# Patient Record
Sex: Female | Born: 1980 | Race: White | Hispanic: No | Smoking: Current every day smoker
Health system: Southern US, Community
[De-identification: ages and names within clinical notes are randomized; demographics above are authoritative.]

## PROBLEM LIST (undated history)

## (undated) DIAGNOSIS — F419 Anxiety disorder, unspecified: Secondary | ICD-10-CM

## (undated) HISTORY — DX: Anxiety disorder, unspecified: F41.9

---

## 2013-10-11 ENCOUNTER — Emergency Department: Payer: Self-pay | Admitting: Emergency Medicine

## 2013-10-12 ENCOUNTER — Emergency Department: Payer: Self-pay | Admitting: Emergency Medicine

## 2013-10-14 ENCOUNTER — Emergency Department: Payer: Self-pay

## 2014-09-14 IMAGING — CR RIGHT TIBIA AND FIBULA - 2 VIEW
1 series · 3 of 3 positions shown · non-contrast
Comparison: 10/11/2013

CLINICAL DATA: Known tibial fracture. Continued pain. No new new
injuries.

EXAM:
RIGHT TIBIA AND FIBULA - 2 VIEW

[Series 1: x tib-fib ap right · 0.14mm/px · 3 of 3 slices shown]
[im 1/3]
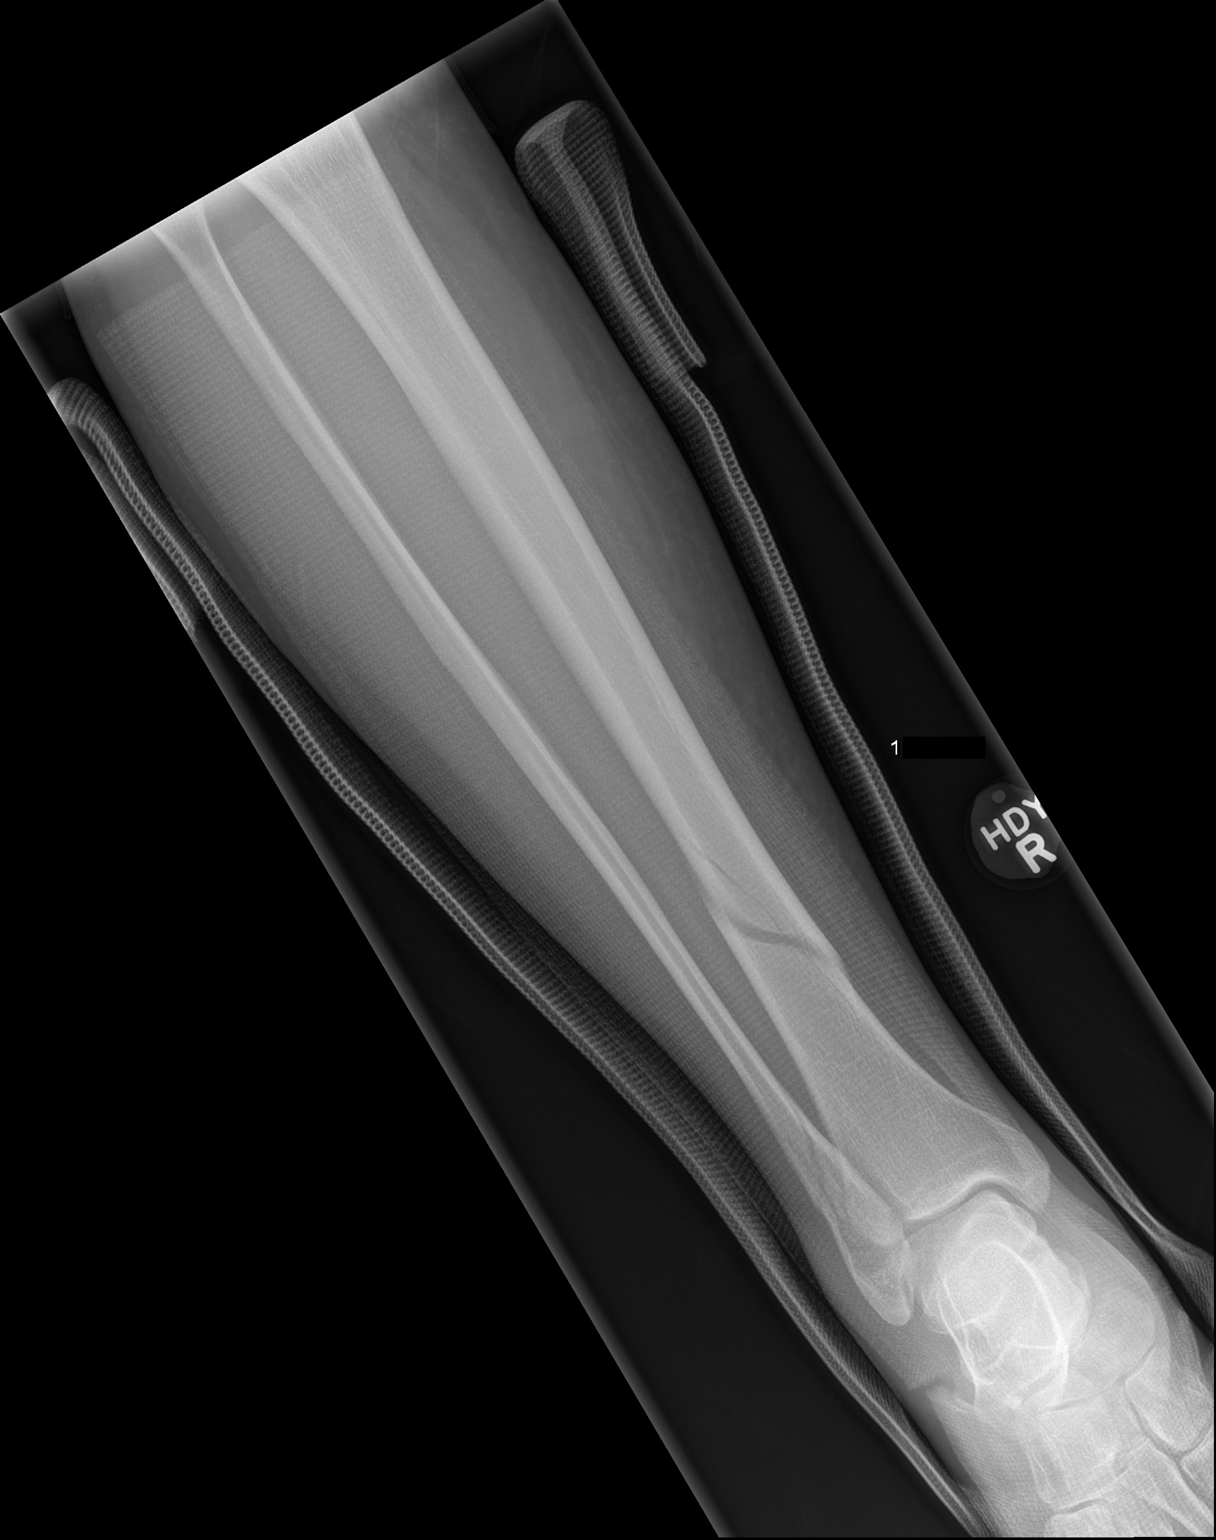
[im 2/3]
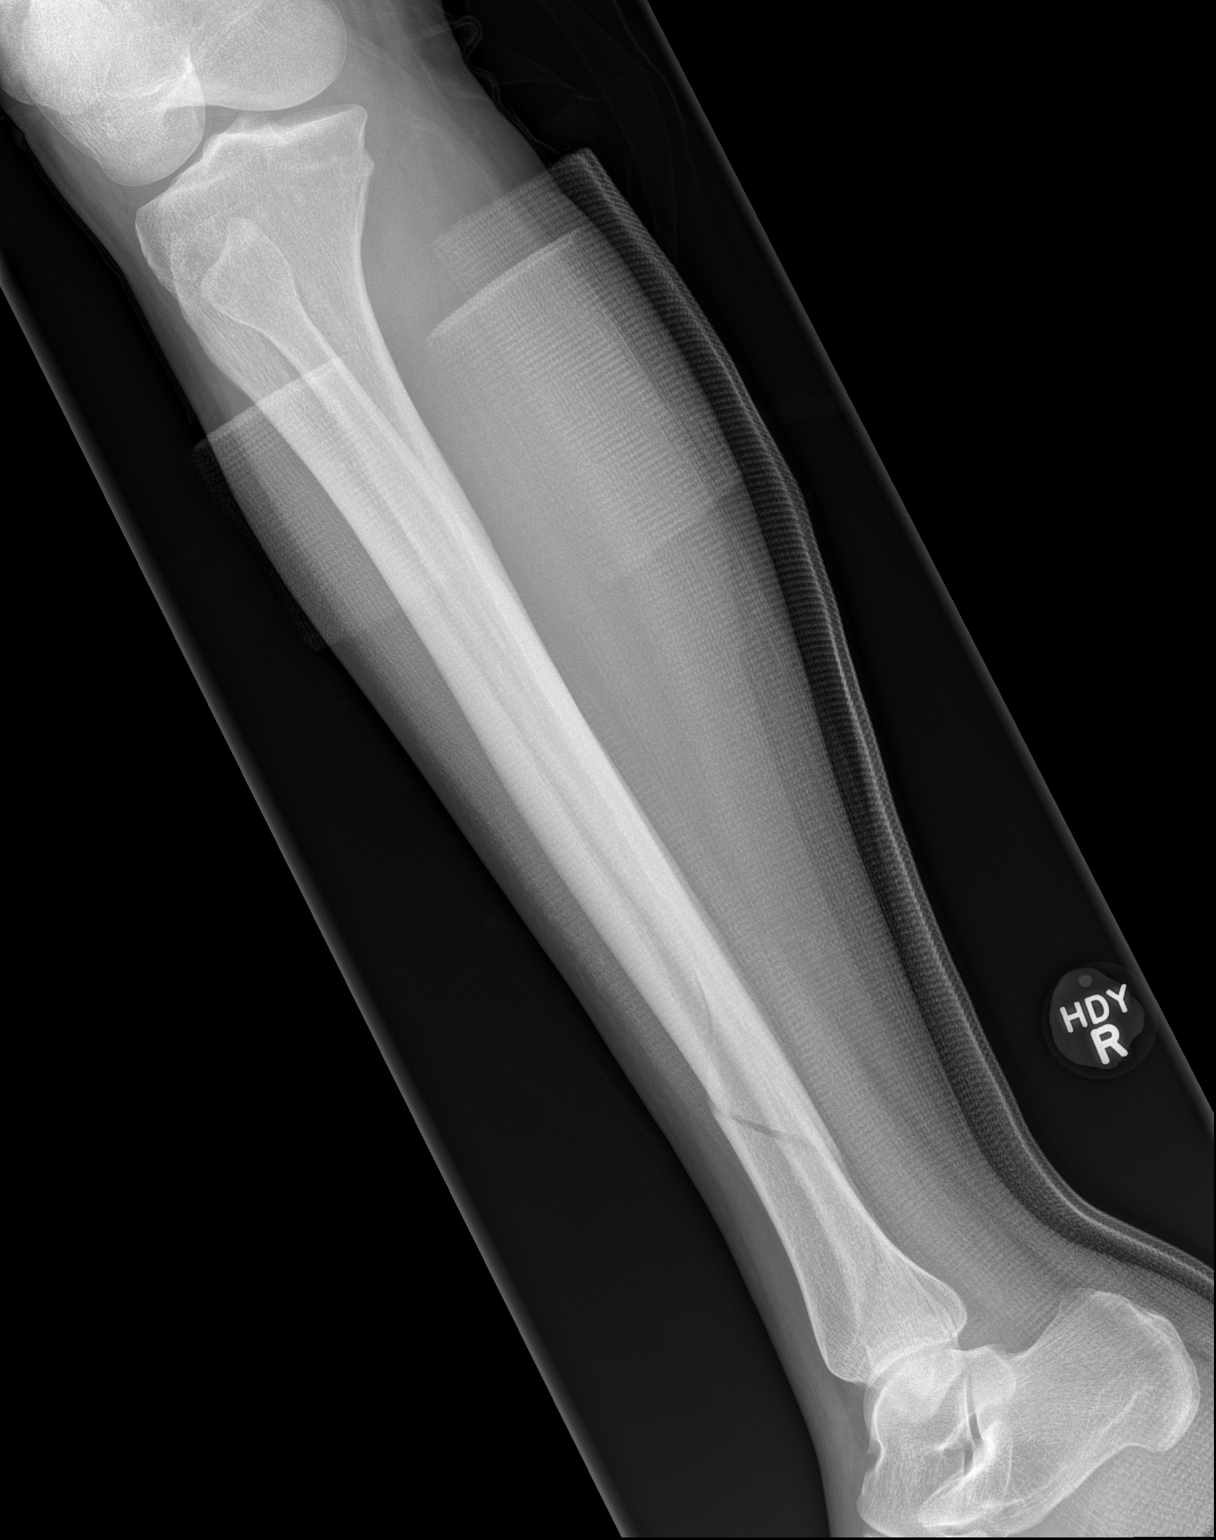
[im 3/3]
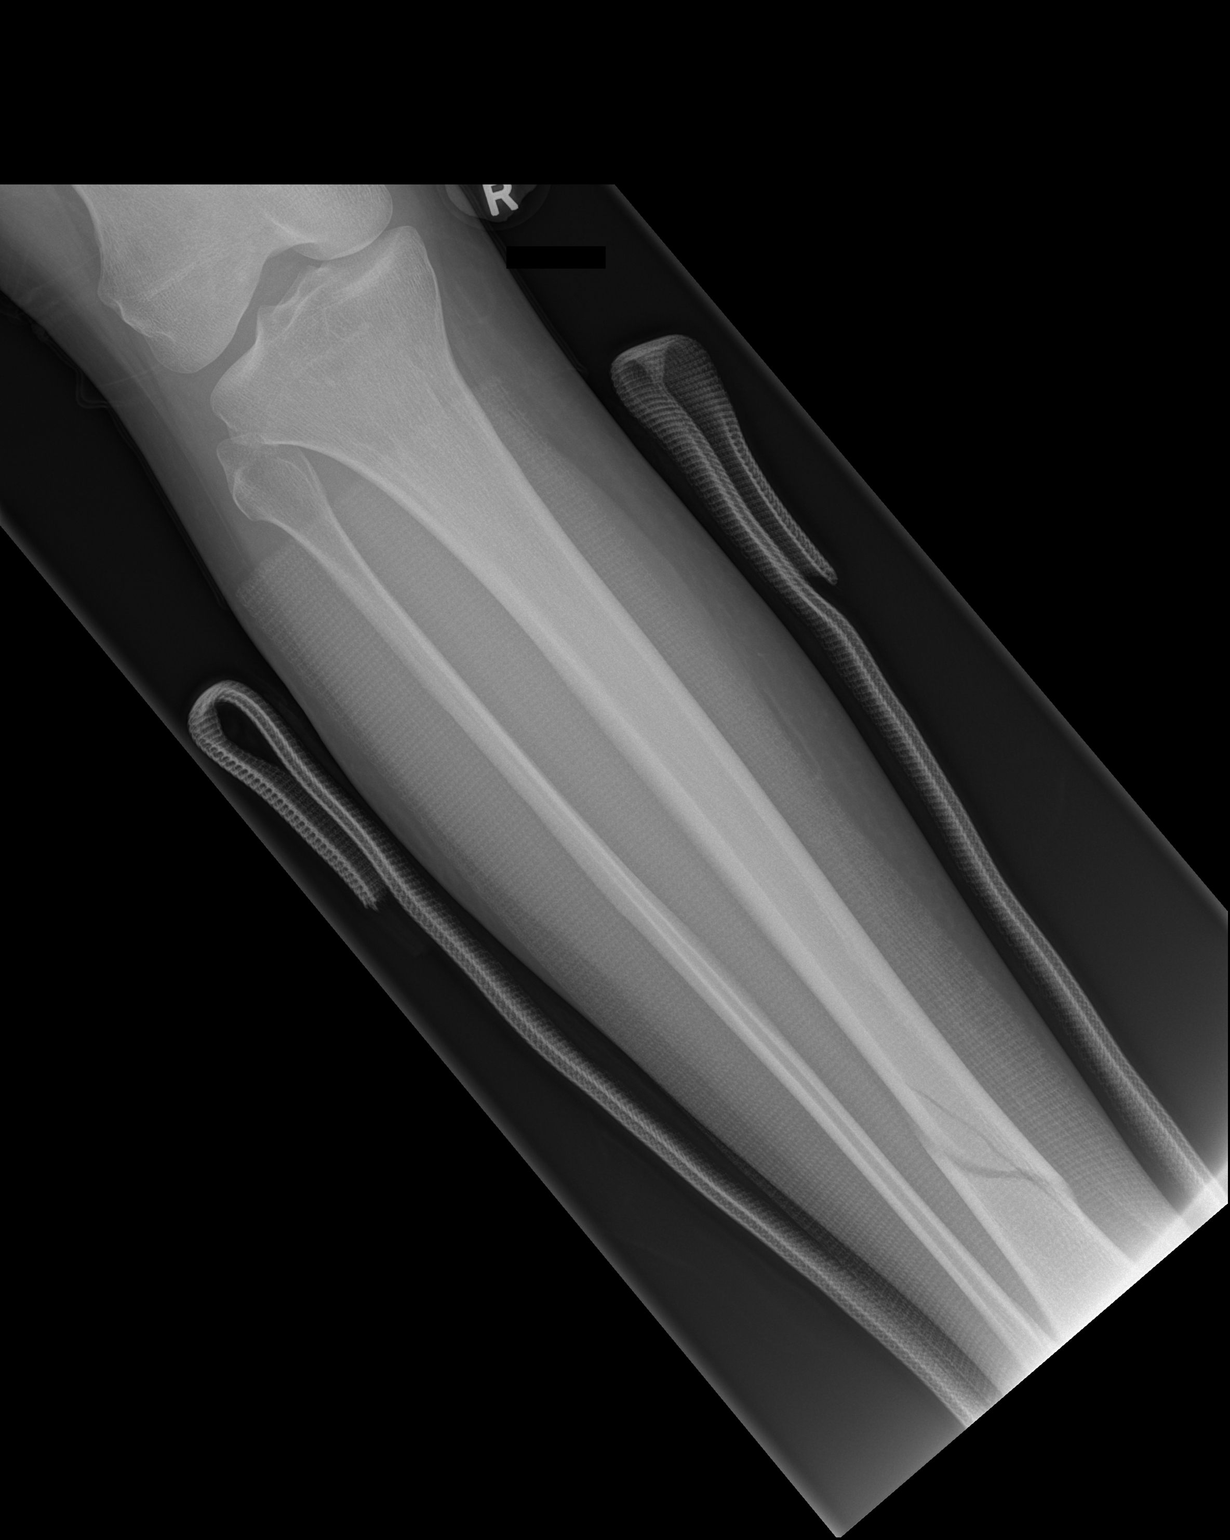

[3 of 3 positions shown; findings below may reference images not displayed]

FINDINGS: Spiral fracture of the distal tibial shaft is nondisplaced and
nonangulated and unchanged. A spiral fracture of the distal fibula
crossing the meta diaphysis is also stable and nondisplaced and
nonangulated.

Leg is now supported by a posterior splint.

No new fractures. The knee joint and ankle joint are normally
aligned.
IMPRESSION: Stable fractures of the distal right tibia and fibula. No new
abnormalities.

## 2014-12-03 ENCOUNTER — Emergency Department: Payer: Self-pay | Admitting: Emergency Medicine
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
## 2015-05-28 ENCOUNTER — Ambulatory Visit: Payer: Medicaid Other | Attending: Family Medicine | Admitting: Physical Therapy

## 2015-05-28 ENCOUNTER — Encounter: Payer: Self-pay | Admitting: Physical Therapy

## 2015-05-28 DIAGNOSIS — M25522 Pain in left elbow: Secondary | ICD-10-CM | POA: Diagnosis present

## 2015-05-28 DIAGNOSIS — M25521 Pain in right elbow: Secondary | ICD-10-CM | POA: Insufficient documentation

## 2015-05-28 DIAGNOSIS — M545 Low back pain, unspecified: Secondary | ICD-10-CM
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
## 2015-06-17 ENCOUNTER — Ambulatory Visit: Payer: BLUE CROSS/BLUE SHIELD | Attending: Family Medicine | Admitting: Physical Therapy

## 2015-06-17 DIAGNOSIS — M25522 Pain in left elbow: Secondary | ICD-10-CM | POA: Insufficient documentation

## 2015-06-17 DIAGNOSIS — M25521 Pain in right elbow: Secondary | ICD-10-CM | POA: Insufficient documentation

## 2015-06-17 DIAGNOSIS — M545 Low back pain: Secondary | ICD-10-CM | POA: Insufficient documentation

## 2015-06-19 ENCOUNTER — Encounter: Payer: Self-pay | Admitting: Physical Therapy

## 2015-06-19 ENCOUNTER — Ambulatory Visit: Payer: BLUE CROSS/BLUE SHIELD | Admitting: Physical Therapy

## 2015-06-19 DIAGNOSIS — M545 Low back pain, unspecified: Secondary | ICD-10-CM

## 2015-06-19 DIAGNOSIS — M25521 Pain in right elbow: Secondary | ICD-10-CM

## 2015-06-19 DIAGNOSIS — M25522 Pain in left elbow: Secondary | ICD-10-CM | POA: Diagnosis present

## 2015-06-19 NOTE — Therapy (Signed)
[facility] MAIN [facility] SERVICES [address] [place], [place], [address] Phone: [phone]   Fax:  [phone]  Physical Therapy Treatment  Patient Details  Name: Sonya Lewis MRN: [id number] Date of Birth: 1981-07-22 Referring Provider:  Titus Mould*  Encounter Date: 06/19/2015      PT End of Session - 06/19/15 1552    Visit Number 2   Number of Visits 16   Date for PT Re-Evaluation 07/23/15   Authorization Type getting private insurance Jun 14, 2015   PT Start Time 1349   PT Stop Time 1434   PT Time Calculation (min) 45 min   Activity Tolerance Patient limited by pain   Behavior During Therapy [facility] for tasks assessed/performed      Past Medical History  Diagnosis Date  . Anxiety     controlled    History reviewed. No pertinent past surgical history.  There were no vitals filed for this visit.  Visit Diagnosis:  Bilateral low back pain without sciatica  Elbow pain, left  Elbow pain, right      Subjective Assessment - 06/19/15 1359    Subjective Patient reports continued bilateral elbow pain (L>R) she also continues to have soreness in low back. Patient denies any radicular symptoms.    Limitations Lifting   How long can you sit comfortably? 30 min   How long can you stand comfortably? 30 min   How long can you walk comfortably? 30 min   Diagnostic tests Denies any radiographs of back and elbow;    Patient Stated Goals Pain relief   Currently in Pain? Yes   Pain Score 5    Pain Location Back   Pain Orientation Lower   Pain Descriptors / Indicators Aching   Pain Type --   Pain Onset 1 to 4 weeks ago   Pain Score 7   Pain Location Elbow   Pain Orientation Medial  bilaterally   Pain Descriptors / Indicators Tender;Sore;Sharp   Pain Onset 1 to 4 weeks ago          TREATMENT: PT identified increased pain in bilateral elbows Assessed strength: elbow flexion: R: 4/5 (no pain), L: 4-/5 (slight pain)                                Elbow extension: R: 3+/5 (no pain), L: 3+/5 (slight pain)                                 Pronation: painful with resistance  Patient reports slight increase in pain with wrist flexion, but no pain with extension;  PT instructed patient in wrist flexor stretch with cues to increase supination for better pronator stretch 20 sec hold x2 bilaterally;  PT identified increased tightness and trigger points in bilateral wrist flexors. PT performed soft/deep tissue massage including cross friction, myofascial release and sustained pressure for manual trigger point release to bilateral forearm (L>R) x20 min;  Patient continues to have trigger points in wrist flexors that refer pain to elbow after manual therapy;  Patient received dry needling therapy education and acknowledged understanding of risks and benefits of dry needling therapy prior to receiving treatment. Patient voiced understanding of treatment options and elected to proceed with dry needling therapy.   Morrie Sheldon Tortorici PT, DPT who is certified in dry needling, performed dry needling to patient's left  pronator teres, flexor digitorum superficialis, and flexor carpi ulnaris. Patient tolerated well with deep ache sensation. No local twitch response was noted. She experienced slightly less discomfort after treatment.                        PT Education - 06/19/15 1552    Education provided Yes   Education Details re-educated patient in bilateral elbow flexor stretches, with cues for positioning;    Person(s) Educated Patient   Methods Explanation;Demonstration;Tactile cues;Verbal cues   Comprehension Verbalized understanding;Returned demonstration;Verbal cues required;Tactile cues required             PT Long Term Goals - 05/28/15 1646    PT LONG TERM GOAL #1   Title Patient will be independent in home exercise program to improve strength/mobility for better functional independence with  ADLs by 07/23/15   Time 8   Period Weeks   Status New   PT LONG TERM GOAL #2   Title Patient will report a worst pain of 3/10 on VAS in   low back          to improve tolerance with ADLs and reduced symptoms with activities by 07/23/15   Time 8   Period Weeks   Status New   PT LONG TERM GOAL #3   Title Patient will report a worst pain of 3/10 on VAS in    B elbows         to improve tolerance with lifting and household ADLs by 07/23/15   Time 8   Period Weeks   Status New   PT LONG TERM GOAL #4   Title  Patient will reduce modified Oswestry score to <20 as to demonstrate minimal disability with ADLs including improved sleeping tolerance, walking/sitting tolerance etc for better mobility with ADLs by 07/23/15   Time 8   Period Weeks   Status New   PT LONG TERM GOAL #5   Title Patient will be independent in lifting 30 pounds from floor to waist and carrying at least 10 feet for independence in laudry by 07/23/15   Time 8   Period Weeks   Status New               Plan - 06/19/15 1553    Clinical Impression Statement Patient continues to have elbow pain which is more severe than back pain. PT identified increased tightness and trigger points in bilateral forearm wrist flexors (L>R). PT instructed patient in wrist flexor stretch and also performed extensive manual therapy including soft/deep tissue massage, myofascial release and sustained pressure for trigger point release. Morrie Sheldon Tortorici PT, DPT, certified in dry needling performed dry neeling to left forearm to reduce trigger points for less elbow pain. Patient continues to be limited by pain at end of treatment session. She would benefit from additional skilled PT intervention to reduce pain and improve tolerance with ADLs.   Pt will benefit from skilled therapeutic intervention in order to improve on the following deficits Improper body mechanics;Postural dysfunction;Difficulty walking;Decreased mobility;Decreased activity  tolerance;Hypomobility;Pain;Impaired UE functional use   Rehab Potential Good   Clinical Impairments Affecting Rehab Potential positive: motivated, good PLOF, negative: severe pain;    PT Frequency 2x / week   PT Duration 8 weeks   PT Treatment/Interventions Gait training;Functional mobility training;Manual techniques;Therapeutic activities;Cryotherapy;Therapeutic exercise;Electrical Stimulation;Iontophoresis /ml Dexamethasone;Passive range of motion;Moist Heat;Dry needling;Traction;Ultrasound;Patient/family education;Energy conservation;Taping   PT Next Visit Plan continue with manual therapy/modalities, dry needling if indicated, advance exercise  PT Home Exercise Plan instructed patient in wrist flexor stretches   Consulted and Agree with Plan of Care Patient        Problem List There are no active problems to display for this patient.    Hopkins,Margaret PT, DPT 06/19/2015, 4:35 PM  [facility] MAIN [facility] SERVICES [address] [place], [place], [address] Phone: [phone]   Fax:  [phone]

## 2015-06-24 ENCOUNTER — Ambulatory Visit: Payer: BLUE CROSS/BLUE SHIELD | Admitting: Physical Therapy

## 2015-06-26 ENCOUNTER — Ambulatory Visit: Payer: BLUE CROSS/BLUE SHIELD | Admitting: Physical Therapy

## 2015-06-27 ENCOUNTER — Emergency Department
Admission: EM | Admit: 2015-06-27 | Discharge: 2015-06-27 | Disposition: A | Discharge status: Home / Self Care | Payer: BLUE CROSS/BLUE SHIELD | Attending: Emergency Medicine | Admitting: Emergency Medicine

## 2015-06-27 ENCOUNTER — Encounter: Payer: Self-pay | Admitting: Emergency Medicine

## 2015-06-27 ENCOUNTER — Emergency Department: Payer: BLUE CROSS/BLUE SHIELD

## 2015-06-27 DIAGNOSIS — Z720 Tobacco use: Secondary | ICD-10-CM | POA: Insufficient documentation

## 2015-06-27 DIAGNOSIS — Z79899 Other long term (current) drug therapy: Secondary | ICD-10-CM | POA: Insufficient documentation

## 2015-06-27 DIAGNOSIS — S40012A Contusion of left shoulder, initial encounter: Secondary | ICD-10-CM | POA: Diagnosis not present

## 2015-06-27 DIAGNOSIS — Y9289 Other specified places as the place of occurrence of the external cause: Secondary | ICD-10-CM | POA: Insufficient documentation

## 2015-06-27 DIAGNOSIS — Y9389 Activity, other specified: Secondary | ICD-10-CM | POA: Diagnosis not present

## 2015-06-27 DIAGNOSIS — W108XXA Fall (on) (from) other stairs and steps, initial encounter: Secondary | ICD-10-CM | POA: Diagnosis not present

## 2015-06-27 DIAGNOSIS — S4992XA Unspecified injury of left shoulder and upper arm, initial encounter: Secondary | ICD-10-CM | POA: Diagnosis present

## 2015-06-27 DIAGNOSIS — Y998 Other external cause status: Secondary | ICD-10-CM | POA: Insufficient documentation

## 2015-06-27 MED ORDER — NAPROXEN 500 MG PO TABS: 500.0000 mg | ORAL_TABLET | Freq: Two times a day (BID) | ORAL | Status: AC

## 2015-06-27 MED ORDER — HYDROCODONE-ACETAMINOPHEN 5-325 MG PO TABS
1.0000 | ORAL_TABLET | Freq: Once | ORAL | Status: AC
  Administered 2015-06-27: 1 via ORAL
  Filled 2015-06-27: qty 1

## 2015-06-27 MED ORDER — HYDROCODONE-ACETAMINOPHEN 5-325 MG PO TABS: 1.0000 | ORAL_TABLET | ORAL | Status: AC | PRN
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
## 2015-06-27 NOTE — Discharge Instructions (Signed)
Contusion °A contusion is a deep bruise. Contusions happen when an injury causes bleeding under the skin. Symptoms of bruising include pain, swelling, and discolored skin. The skin may turn blue, purple, or yellow. °HOME CARE  °· Rest the injured area. °· If told, put ice on the injured area. °· Put ice in a plastic bag. °· Place a towel between your skin and the bag. °· Leave the ice on for 20 minutes, 2-3 times per day. °· If told, put light pressure (compression) on the injured area using an elastic bandage. Make sure the bandage is not too tight. Remove it and put it back on as told by your doctor. °· If possible, raise (elevate) the injured area above the level of your heart while you are sitting or lying down. °· Take over-the-counter and prescription medicines only as told by your doctor. °GET HELP IF: °· Your symptoms do not get better after several days of treatment. °· Your symptoms get worse. °· You have trouble moving the injured area. °GET HELP RIGHT AWAY IF:  °· You have very bad pain. °· You have a loss of feeling (numbness) in a hand or foot. °· Your hand or foot turns pale or cold. °  °This information is not intended to replace advice given to you by your health care provider. Make sure you discuss any questions you have with your health care provider. °  °Document Released: 02/16/2008 Document Revised: 05/21/2015 Document Reviewed: 01/15/2015 °Elsevier Interactive Patient Education ©2016 [employer] ° °Cryotherapy °Cryotherapy is when you put ice on your injury. Ice helps lessen pain and puffiness (swelling) after an injury. Ice works the best when you start using it in the first 24 to 48 hours after an injury. °HOME CARE °· Put a dry or damp towel between the ice pack and your skin. °· You may press gently on the ice pack. °· Leave the ice on for no more than 10 to 20 minutes at a time. °· Check your skin after 5 minutes to make sure your skin is okay. °· Rest at least 20 minutes between ice  pack uses. °· Stop using ice when your skin loses feeling (numbness). °· Do not use ice on someone who cannot tell you when it hurts. This includes small children and people with memory problems (dementia). °GET HELP RIGHT AWAY IF: °· You have white spots on your skin. °· Your skin turns blue or pale. °· Your skin feels waxy or hard. °· Your puffiness gets worse. °MAKE SURE YOU:  °· Understand these instructions. °· Will watch your condition. °· Will get help right away if you are not doing well or get worse. °  °This information is not intended to replace advice given to you by your health care provider. Make sure you discuss any questions you have with your health care provider. °  °Document Released: 02/16/2008 Document Revised: 11/22/2011 Document Reviewed: 04/22/2011 °Elsevier Interactive Patient Education ©2016 [employer]vier Inc. ° °

## 2015-06-27 NOTE — ED Provider Notes (Signed)
[facility] Emergency Department Provider Note  ____________________________________________  Time seen: Approximately 9:55 AM  I have reviewed the triage vital signs and the nursing notes.   HISTORY  Chief Complaint Shoulder Pain   HPI Sonya Lewis is a 34 y.o. female is here complaining of left shoulder pain. Patient states she slipped down the basement steps and hit her shoulder. She denies any head injury or loss of consciousness. She has taken Tylenol at approximately 4 AM this morning for the pain. She denies any previous injury to her shoulder. Pain is increased with range of motion and also with deep inspiration. Holding it against her body seems to help with pain but with minimal improvement. Currently she rates her pain as 10 out of 10. She denies any neck pain with this.She denies any nausea, vomiting or visual changes.   Past Medical History  Diagnosis Date  . Anxiety     controlled    There are no active problems to display for this patient.   History reviewed. No pertinent past surgical history.  Current Outpatient Rx  Name  Route  Sig  Dispense  Refill  . amphetamine-dextroamphetamine (ADDERALL) 15 MG tablet   Oral   Take 15 mg by mouth daily.         . cyclobenzaprine (FLEXERIL) 5 MG tablet   Oral   Take 5 mg by mouth 3 (three) times daily as needed for muscle spasms.         Marland Kitchen. HYDROcodone-acetaminophen (NORCO/VICODIN) 5-325 MG tablet   Oral   Take 1 tablet by mouth every 4 (four) hours as needed for moderate pain.   20 tablet   0   . naproxen (NAPROSYN) 500 MG tablet   Oral   Take 1 tablet (500 mg total) by mouth 2 (two) times daily with a meal.   30 tablet   0     Allergies Review of patient's allergies indicates no known allergies.  No family history on file.  Social History Social History  Substance Use Topics  . Smoking status: Current Every Day Smoker -- 0.50 packs/day for 17 years  . Smokeless tobacco:  None  . Alcohol Use: Yes    Review of Systems Constitutional: No fever/chills Eyes: No visual changes. Cardiovascular: Denies chest pain. Respiratory: Denies shortness of breath. Gastrointestinal: No abdominal pain.  No nausea, no vomiting.  Genitourinary: Negative for dysuria. Musculoskeletal: Negative for back pain. Left shoulder pain  Skin: Negative for rash. Neurological: Negative for headaches, focal weakness or numbness.  10-point ROS otherwise negative.  ____________________________________________   PHYSICAL EXAM:  VITAL SIGNS: ED Triage Vitals  Enc Vitals Group     BP 06/27/15 0924 130/83 mmHg     Pulse Rate 06/27/15 0924 114     Resp 06/27/15 0924 20     Temp 06/27/15 0924 98.2 F (36.8 C)     Temp Source 06/27/15 0924 Oral     SpO2 06/27/15 0924 98 %     Weight 06/27/15 0924 130 lb (58.968 kg)     Height 06/27/15 0924 5\' 5"  (1.651 m)     Head Cir --      Peak Flow --      Pain Score 06/27/15 0924 10     Pain Loc --      Pain Edu? --      Excl. in GC? --     Constitutional: Alert and oriented. Well appearing and in no acute distress. Eyes: Conjunctivae are normal. PERRL.  EOMI. Head: Atraumatic. Nose: No congestion/rhinnorhea. Neck: No stridor.  No cervical tenderness on palpation. Cardiovascular: Normal rate, regular rhythm. Grossly normal heart sounds.  Good peripheral circulation. Respiratory: Normal respiratory effort.  No retractions. Lungs CTAB. Gastrointestinal: Soft and nontender. No distention.  Musculoskeletal: Left shoulder no gross deformity. There is moderate tenderness on palpation of posterior left shoulder. There is some tenderness on palpation of the before meals joint without deformity. Range of motion is restricted secondary to discomfort. There is no edema noted. Motor sensory function intact. No lower extremity tenderness nor edema.  No joint effusions. Neurologic:  Normal speech and language. No gross focal neurologic deficits are  appreciated. No gait instability. Skin:  Skin is warm, dry and intact. No rash noted. There is no abrasions or ecchymosis noted on the left shoulder. Psychiatric: Mood and affect are normal. Speech and behavior are normal.  ____________________________________________   LABS (all labs ordered are listed, but only abnormal results are displayed)  Labs Reviewed - No data to display   RADIOLOGY  X-ray left shoulder per radiologist shows no fracture dislocation. I, Tommi Rumps, personally viewed and evaluated these images (plain radiographs) as part of my medical decision making.  ____________________________________________   PROCEDURES  Procedure(s) performed: None  Critical Care performed: No  ____________________________________________   INITIAL IMPRESSION / ASSESSMENT AND PLAN / ED COURSE  Pertinent labs & imaging results that were available during my care of the patient were reviewed by me and considered in my medical decision making (see chart for details).  Patient was placed in a sling. She is use ice to her shoulder area. She is also given a prescription for naproxen 500 mg twice a day with food and hydrocodone for severe pain. She is to follow-up with her PCP or Dr. Martha Clan if any continued problems.     FINAL CLINICAL IMPRESSION(S) / ED DIAGNOSES  Final diagnoses:  Contusion of left shoulder, initial encounter      Tommi Rumps, PA-C 06/27/15 1037  Sharyn Creamer, MD 06/27/15 [phone]

## 2015-07-01 ENCOUNTER — Ambulatory Visit: Payer: BLUE CROSS/BLUE SHIELD | Admitting: Physical Therapy

## 2015-07-01 DIAGNOSIS — M545 Low back pain, unspecified: Secondary | ICD-10-CM

## 2015-07-01 DIAGNOSIS — M25521 Pain in right elbow: Secondary | ICD-10-CM

## 2015-07-01 DIAGNOSIS — M25522 Pain in left elbow: Secondary | ICD-10-CM

## 2015-07-02 NOTE — Therapy (Signed)
[place] [facility] [address] [place], [place], [address] Phone: [phone]   Fax:  [phone]  Physical Therapy Treatment  Patient Details  Name: Sonya Lewis MRN: [id number] Date of Birth: 1981-06-11 No Data Recorded  Encounter Date: 07/01/2015      PT End of Session - 07/01/15 1031    Visit Number 3   Number of Visits 16   Date for PT Re-Evaluation 07/23/15   Authorization Type getting private insurance Jun 14, 2015   PT Start Time [phone]   PT Stop Time 0930   PT Time Calculation (min) 40 min   Activity Tolerance No increased pain;Patient tolerated treatment well   Behavior During Therapy [facility] for tasks assessed/performed      Past Medical History  Diagnosis Date  . Anxiety     controlled    No past surgical history on file.  There were no vitals filed for this visit.  Visit Diagnosis:  Bilateral low back pain without sciatica  Elbow pain, left  Elbow pain, right      Subjective Assessment - 07/01/15 0854    Subjective Patient reports bilateral elbow pain and L shoulder pain which started after she experienced a fall earlier this week. She went to the ER for X-rays, which were negative. She reports lower back pain    Limitations Lifting   How long can you sit comfortably? 30 min   How long can you stand comfortably? 30 min   How long can you walk comfortably? 30 min   Diagnostic tests Denies any radiographs of back and elbow;    Patient Stated Goals Pain relief   Currently in Pain? --  Patient reports pain in her elbows bilaterally, L shoulder with rotation, and  mid to lower back      Manual Therapy   Soft tissue mobilization provided to L lumbar and flank musculature with nearly complete resolution of lumbar complaints.   Attempted soft tissue mobilization to LUE in distal biceps tendon and lateral epicondyle, increased in sensitivity and discontinued.  Collateral ligament testing in LUE revealed  firm end feel bilaterally with no laxity noted medially or laterally.   Dry needling was performed in this session on L pronator teres without twitch response. Dry needling provided by Haskel KhanAshley Tortorici PT, DPT who is a certified dry needling practitioner. Risks and benefits were explained for dry needling and patient consented to treatment.                        PT Education - 07/01/15 1030    Education provided Yes   Education Details Educated patient on pain neuroscience. Use of ice on her R elbow, heat on her lower back.    Person(s) Educated Patient   Methods Explanation;Demonstration   Comprehension Verbalized understanding;Returned demonstration             PT Long Term Goals - 05/28/15 1646    PT LONG TERM GOAL #1   Title Patient will be independent in home exercise program to improve strength/mobility for better functional independence with ADLs by 07/23/15   Time 8   Period Weeks   Status New   PT LONG TERM GOAL #2   Title Patient will report a worst pain of 3/10 on VAS in   low back          to improve tolerance with ADLs and reduced symptoms with activities by 07/23/15   Time 8  Period Weeks   Status New   PT LONG TERM GOAL #3   Title Patient will report a worst pain of 3/10 on VAS in    B elbows         to improve tolerance with lifting and household ADLs by 07/23/15   Time 8   Period Weeks   Status New   PT LONG TERM GOAL #4   Title  Patient will reduce modified Oswestry score to <20 as to demonstrate minimal disability with ADLs including improved sleeping tolerance, walking/sitting tolerance etc for better mobility with ADLs by 07/23/15   Time 8   Period Weeks   Status New   PT LONG TERM GOAL #5   Title Patient will be independent in lifting 30 pounds from floor to waist and carrying at least 10 feet for independence in laudry by 07/23/15   Time 8   Period Weeks   Status New               Plan - 07/01/15 1033    Clinical Impression  Statement Patient reports significant decrease in lower back pain symptoms after soft tissue mobilization. Patient reports proper response to dry needling of RUE, she reported significant improvement from needling in previous session. Patient continues to be quite sensitivie around her medial epicondyle of bilateral elbows, on RUE ligamentous testing revealed firm end feel with discomfort but no discernable laxity present. She may be experiencing pain at this spot in response to thoracic pain inhibiting periscapular musculature. Skilled PT services continue to be indicated to address her mobility and functional activity limitations.    Pt will benefit from skilled therapeutic intervention in order to improve on the following deficits Improper body mechanics;Postural dysfunction;Difficulty walking;Decreased mobility;Decreased activity tolerance;Hypomobility;Pain;Impaired UE functional use   Rehab Potential Good   Clinical Impairments Affecting Rehab Potential positive: motivated, good PLOF, negative: severe pain;    PT Frequency 2x / week   PT Duration 8 weeks   PT Treatment/Interventions Gait training;Functional mobility training;Manual techniques;Therapeutic activities;Cryotherapy;Therapeutic exercise;Electrical Stimulation;Iontophoresis /ml Dexamethasone;Passive range of motion;Moist Heat;Dry needling;Traction;Ultrasound;Patient/family education;Energy conservation;Taping   PT Next Visit Plan continue with manual therapy/modalities, dry needling if indicated, advance exercise   PT Home Exercise Plan Maintained from previous    Consulted and Agree with Plan of Care Patient        Problem List There are no active problems to display for this patient.  Kerin Ransom, PT, DPT    07/02/2015, 3:14 PM  [place] [facility] MAIN [facility] SERVICES [address] [place], [place], [address] Phone: [phone]   Fax:  [phone]  Name: Sonya Lewis MRN:  [id number] Date of Birth: 1980-11-04

## 2015-07-03 ENCOUNTER — Encounter: Payer: Medicaid Other | Admitting: Physical Therapy

## 2015-07-04 ENCOUNTER — Ambulatory Visit: Payer: BLUE CROSS/BLUE SHIELD | Admitting: Physical Therapy

## 2015-07-08 ENCOUNTER — Encounter: Payer: Self-pay | Admitting: Physical Therapy

## 2015-07-10 ENCOUNTER — Encounter: Payer: BLUE CROSS/BLUE SHIELD | Admitting: Physical Therapy

## 2015-07-15 ENCOUNTER — Ambulatory Visit: Payer: BLUE CROSS/BLUE SHIELD | Attending: Family Medicine | Admitting: Physical Therapy

## 2015-07-15 ENCOUNTER — Encounter: Payer: Self-pay | Admitting: Physical Therapy

## 2015-07-15 DIAGNOSIS — M25522 Pain in left elbow: Secondary | ICD-10-CM | POA: Diagnosis present

## 2015-07-15 DIAGNOSIS — M545 Low back pain, unspecified: Secondary | ICD-10-CM

## 2015-07-15 DIAGNOSIS — M25521 Pain in right elbow: Secondary | ICD-10-CM

## 2015-07-15 NOTE — Therapy (Signed)
[facility] [address] [place], [place], [address] Phone: [phone]   Fax:  [phone]  Physical Therapy Treatment  Patient Details  Name: Sonya Lewis MRN: [id number] Date of Birth: 1981/06/20 No Data Recorded  Encounter Date: 07/15/2015      PT End of Session - 07/15/15 0944    Visit Number 4   Number of Visits 16   Date for PT Re-Evaluation 07/23/15   Authorization Type getting private insurance Jun 14, 2015   PT Start Time 0805   PT Stop Time [phone]   PT Time Calculation (min) 41 min   Activity Tolerance Patient limited by pain   Behavior [facility] Therapy [facility] tasks assessed/performed      Past Medical History  Diagnosis Date  . Anxiety     controlled    History reviewed. No pertinent past surgical history.  There were no vitals filed for this visit.  Visit Diagnosis:  Bilateral low back pain without sciatica  Elbow pain, left  Elbow pain, right      Subjective Assessment - 07/15/15 0942    Subjective Patient reports overall less right elbow pain but continued left elbow pain. She reports continued back pain which is less than elbow pain. She reports increased discomfort after having to carry luggage when travelling.   Limitations Lifting   How long can you sit comfortably? 30 min   How long can you stand comfortably? 30 min   How long can you walk comfortably? 30 min   Diagnostic tests Denies any radiographs of back and elbow;    Patient Stated Goals Pain relief   Currently in Pain? Yes   Pain Score 8    Pain Location Elbow   Pain Orientation Left   Pain Descriptors / Indicators Aching;Burning;Tender   Pain Type Acute pain   Pain Onset 1 to 4 weeks ago   Pain Frequency Intermittent   Aggravating Factors  more pain with movement   Pain Relieving Factors heat helps, rest   Effect of Pain on Daily Activities decreased mobility;         TREATMENT: PT instructed patient in bilateral UE  stretches including: Forearm wrist flexor stretch 10 sec hold x2 bilaterally; tricep stretch across chest 15 sec hold x2 bilaterally; tricep overhead stretch 15 sec hold x2 bilaterally;  PT identified increased tightness noted in tricep tendon along elbow which was reduced with tricep stretches. Patient required min VCs for correct positioning during stretch for better tissue extensibility;  PT applied moist heat to bilateral elbows concurrent with back treatment: Patient prone: PT identified increased tightness along bilateral thoracolumbar paraspinals including quadratus lumborum; PT performed soft/deep tissue massage and ASTYM with edge tool to bilateral thoracolumbar paraspinals. Also performed cross friction and ischemic trigger point release to right quadratus lumborus to reduce tightness. Patient able to exhibit good skin and myofascial mobility as evidenced with finger rolling. She does however have trigger points along paraspinals which was reduced with ASTYM and cross friction massage.  Patient reports less back/elbow pain after treatment session;  PT instructed patient in qped stretches: Prayer stretch 10 sec hold x2 Cat/camel stretch 3 sec each direction x4 each;  Patient required min-moderate verbal/tactile cues for correct exercise technique including cues for correct positioning for optimum stretching.                        PT Education - 07/15/15 [phone]    Education provided Yes  Education Details educated patient on UE and back stretches, importance of HEP compliance   Person(s) Educated Patient   Methods Explanation;Demonstration;Verbal cues   Comprehension Verbalized understanding;Returned demonstration;Verbal cues required             PT Long Term Goals - 05/28/15 1646    PT LONG TERM GOAL #1   Title Patient will be independent in home exercise program to improve strength/mobility for better functional independence with ADLs by 07/23/15    Time 8   Period Weeks   Status New   PT LONG TERM GOAL #2   Title Patient will report a worst pain of 3/10 on VAS in   low back          to improve tolerance with ADLs and reduced symptoms with activities by 07/23/15   Time 8   Period Weeks   Status New   PT LONG TERM GOAL #3   Title Patient will report a worst pain of 3/10 on VAS in    B elbows         to improve tolerance with lifting and household ADLs by 07/23/15   Time 8   Period Weeks   Status New   PT LONG TERM GOAL #4   Title  Patient will reduce modified Oswestry score to <20 as to demonstrate minimal disability with ADLs including improved sleeping tolerance, walking/sitting tolerance etc for better mobility with ADLs by 07/23/15   Time 8   Period Weeks   Status New   PT LONG TERM GOAL #5   Title Patient will be independent in lifting 30 pounds from floor to waist and carrying at least 10 feet for independence in laudry by 07/23/15   Time 8   Period Weeks   Status New               Plan - 07/15/15 0944    Clinical Impression Statement Patient is still very pain focused. She is most limited in left elbow with any movement of LUE. She reports less pain in right elbow to 2-3/10 which seems to be improving. Patient was instructed in additional UE stretches. PT applied moist heat to BUE elbows concurrent with back treatment. PT identified increased tightness along bilateral thoracolumbar paraspinals (R>L) and increased tightness along right quadratus lumborum. She continues to report increased pain at end of treatment session despite decreased tightness noted by PT. Patient would benefit from additional skilled PT Intervention to reduce pain and improve mobility.    Pt will benefit from skilled therapeutic intervention in order to improve on the following deficits Improper body mechanics;Postural dysfunction;Difficulty walking;Decreased mobility;Decreased activity tolerance;Hypomobility;Pain;Impaired UE functional use   Rehab  Potential Good   Clinical Impairments Affecting Rehab Potential positive: motivated, good PLOF, negative: severe pain;    PT Frequency 2x / week   PT Duration 8 weeks   PT Treatment/Interventions Gait training;Functional mobility training;Manual techniques;Therapeutic activities;Cryotherapy;Therapeutic exercise;Electrical Stimulation;Iontophoresis /ml Dexamethasone;Passive range of motion;Moist Heat;Dry needling;Traction;Ultrasound;Patient/family education;Energy conservation;Taping   PT Next Visit Plan continue with manual therapy/modalities, dry needling if indicated, advance exercise   PT Home Exercise Plan advanced with BUE stretches and quadratus lumborum stretches   Consulted and Agree with Plan of Care Patient        Problem List There are no active problems to display for this patient.   Hopkins,Broden Holt PT, DPT 07/15/2015, 9:47 AM  [place] [facility] MAIN [facility] SERVICES [address] [place], [place], [address] Phone: [phone]   Fax:  [phone]  Name: Sonya Lewis MRN: [id number] Date of Birth: [birth year]

## 2015-07-18 ENCOUNTER — Ambulatory Visit: Payer: BLUE CROSS/BLUE SHIELD | Admitting: Physical Therapy

## 2015-07-18 ENCOUNTER — Encounter: Payer: Self-pay | Admitting: Physical Therapy

## 2015-07-18 DIAGNOSIS — M545 Low back pain, unspecified: Secondary | ICD-10-CM

## 2015-07-18 DIAGNOSIS — M25522 Pain in left elbow: Secondary | ICD-10-CM

## 2015-07-18 DIAGNOSIS — M25521 Pain in right elbow: Secondary | ICD-10-CM

## 2015-07-18 NOTE — Therapy (Signed)
[place] [facility] MAIN [facility] SERVICES [address] [place], [place], [address] Phone: [phone]   Fax:  [phone]  Physical Therapy Treatment  Patient Details  Name: Sonya Lewis MRN: [id number] Date of Birth: Oct 07, 1980 No Data Recorded  Encounter Date: 07/18/2015      PT End of Session - 07/18/15 0910    Visit Number 5   Number of Visits 16   Date for PT Re-Evaluation 07/23/15   Authorization Type getting private insurance Jun 14, 2015   PT Start Time 0800   PT Stop Time 0845   PT Time Calculation (min) 45 min   Activity Tolerance Patient limited by pain   Behavior During Therapy [facility] for tasks assessed/performed      Past Medical History  Diagnosis Date  . Anxiety     controlled    History reviewed. No pertinent past surgical history.  There were no vitals filed for this visit.  Visit Diagnosis:  Bilateral low back pain without sciatica  Elbow pain, left  Elbow pain, right      Subjective Assessment - 07/18/15 0908    Subjective Patient reports compliance with BUE elbow stretches. She denies any pain in right elbow but continues to have pain in left elbow. She also reports continued ache in back. She reports that the "massage helped" after last treatment but she still has some soreness.   Limitations Lifting   How long can you sit comfortably? 30 min   How long can you stand comfortably? 30 min   How long can you walk comfortably? 30 min   Diagnostic tests Denies any radiographs of back and elbow;    Patient Stated Goals Pain relief   Currently in Pain? Yes   Pain Score 6    Pain Location Elbow   Pain Orientation Left   Pain Descriptors / Indicators Aching;Burning;Tender   Pain Onset 1 to 4 weeks ago      TREATMENT: PT instructed patient in bilateral UE stretches including: Forearm wrist flexor stretch 10 sec hold x3 LUE only; Instructed patient in correct posture body mechanics with min VCs to utilize towel roll or  small pillow along lumbar spinal to improve lumbar extension and increase erect posture. Patient required education on how having better posture will allow lumbar spinal muscles to relax and therefore heal as compared to having slumped posture where muscles would be on prolonged stretch and get weaker.  PT performed Ultrasound to left medial elbow in sitting, 1 MHz, 1.8 watts per centimeter squared, x8 min concurrent with biofreeze/gel for topical pain relief. Patient reports feeling warmth from [place] and slightly less pain after treatment. She continues to have soreness after treatment and still has moderate tenderness along tendons.   PT applied moist heat to left elbow concurrent with back treatment: Patient prone: PT identified increased tightness along bilateral lumbar paraspinals including quadratus lumborum; PT performed soft/deep tissue massage and ASTYM with edge tool to bilateral thoracolumbar paraspinals. Also performed cross friction and ischemic trigger point release to right quadratus lumborus to reduce tightness. Patient able to exhibit good skin and myofascial mobility as evidenced with finger rolling. She also exhibits less thoracic paraspinal tightness as compared to previous sessions.  She does however have trigger points along paraspinals which was reduced with ASTYM and cross friction massage.  Patient reports less back/elbow pain after treatment session; Re-educated patient on importance of qped stretches to increase quadratus lumborum flexibility and reduce pain.  PT Education - 07/18/15 0910    Education provided Yes   Education Details body mechanics/posture, ultrasound effects,    Person(s) Educated Patient   Methods Explanation;Demonstration;Verbal cues   Comprehension Returned demonstration;Verbalized understanding;Verbal cues required             PT Long Term Goals - 05/28/15 1646    PT LONG TERM GOAL #1   Title  Patient will be independent in home exercise program to improve strength/mobility for better functional independence with ADLs by 07/23/15   Time 8   Period Weeks   Status New   PT LONG TERM GOAL #2   Title Patient will report a worst pain of 3/10 on VAS in   low back          to improve tolerance with ADLs and reduced symptoms with activities by 07/23/15   Time 8   Period Weeks   Status New   PT LONG TERM GOAL #3   Title Patient will report a worst pain of 3/10 on VAS in    B elbows         to improve tolerance with lifting and household ADLs by 07/23/15   Time 8   Period Weeks   Status New   PT LONG TERM GOAL #4   Title  Patient will reduce modified Oswestry score to <20 as to demonstrate minimal disability with ADLs including improved sleeping tolerance, walking/sitting tolerance etc for better mobility with ADLs by 07/23/15   Time 8   Period Weeks   Status New   PT LONG TERM GOAL #5   Title Patient will be independent in lifting 30 pounds from floor to waist and carrying at least 10 feet for independence in laudry by 07/23/15   Time 8   Period Weeks   Status New               Plan - 07/18/15 [id number]    Clinical Impression Statement Patient continues to have pain in left elbow and lower back. PT performed US to left elbow with biofreeze for topical pain relief. Patient reports soreness afterwards but does report slight improvement of symptoms. PT assessed low back, patient exhibits less tightness in thoracic paraspinals but continues to have pain and tenderness with tightness in quadrtus lumborum and lumbar paraspinals. Educated patient on importance of good posture. She would benefit from additional skilled PT intervention to reduce pain and improve functional mobility.    Pt will benefit from skilled therapeutic intervention in order to improve on the following deficits Improper body mechanics;Postural dysfunction;Difficulty walking;Decreased mobility;Decreased activity  tolerance;Hypomobility;Pain;Impaired UE functional use   Rehab Potential Good   Clinical Impairments Affecting Rehab Potential positive: motivated, good PLOF, negative: severe pain; smoker   PT Frequency 2x / week   PT Duration 8 weeks   PT Treatment/Interventions Gait training;Functional mobility training;Manual techniques;Therapeutic activities;Cryotherapy;Therapeutic exercise;Electrical Stimulation;Iontophoresis 4mg /ml Dexamethasone;Passive range of motion;Moist Heat;Dry needling;Traction;Ultrasound;Patient/family education;Energy conservation;Taping   PT Next Visit Plan continue with manual therapy/modalities, dry needling if indicated, advance exercise   PT Home Exercise Plan Continue as previously given.   Consulted and Agree with Plan of Care Patient        Problem List There are no active problems to display for this patient.   Hopkins,Kealohilani Maiorino PT, DPT 07/18/2015, 9:45 AM  [place] [facility] MAIN [facility] [address] [place], [place], [address] Phone: [phone]   Fax:  [phone]  Name: Sonya Lewis MRN: [id number] Date of Birth: 12-Dec-1980

## 2015-07-22 ENCOUNTER — Ambulatory Visit: Payer: BLUE CROSS/BLUE SHIELD | Admitting: Physical Therapy

## 2015-07-25 ENCOUNTER — Ambulatory Visit: Payer: BLUE CROSS/BLUE SHIELD | Admitting: Physical Therapy

## 2016-05-27 IMAGING — CR DG SHOULDER 2+V*L*
1 series · 3 of 3 positions shown · non-contrast
Comparison: None.

CLINICAL DATA: Pain following fall

EXAM:
LEFT SHOULDER - 2+ VIEW

[Series 1: w shoulder external left · 0.14mm/px · 3 of 3 slices shown]
[im 1/3]
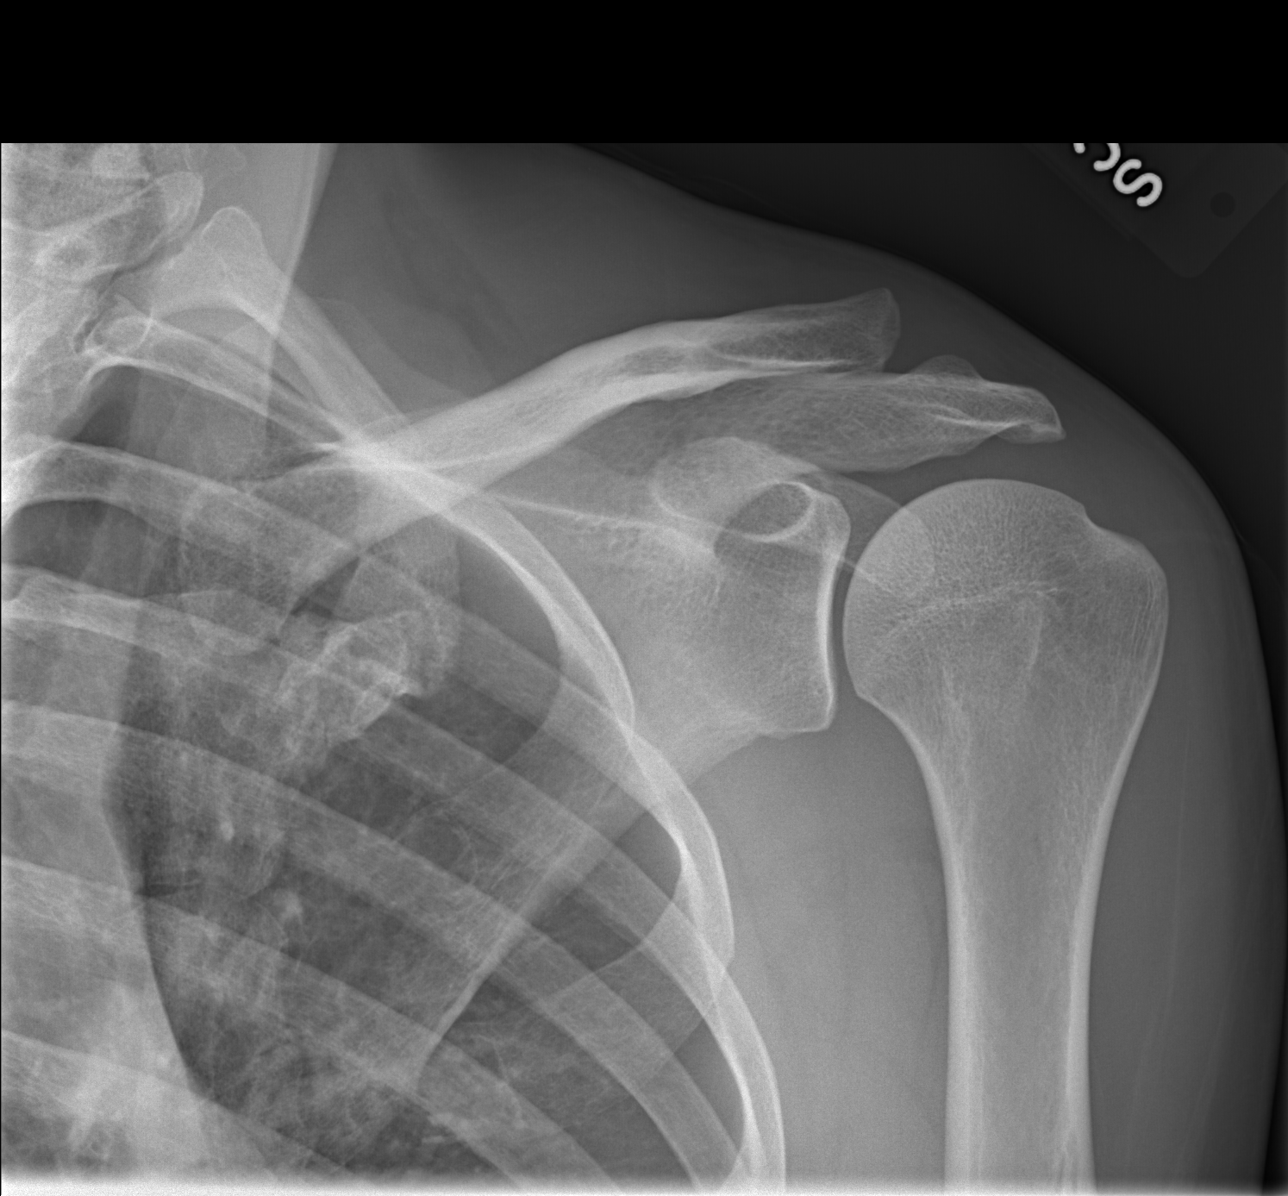
[im 2/3]
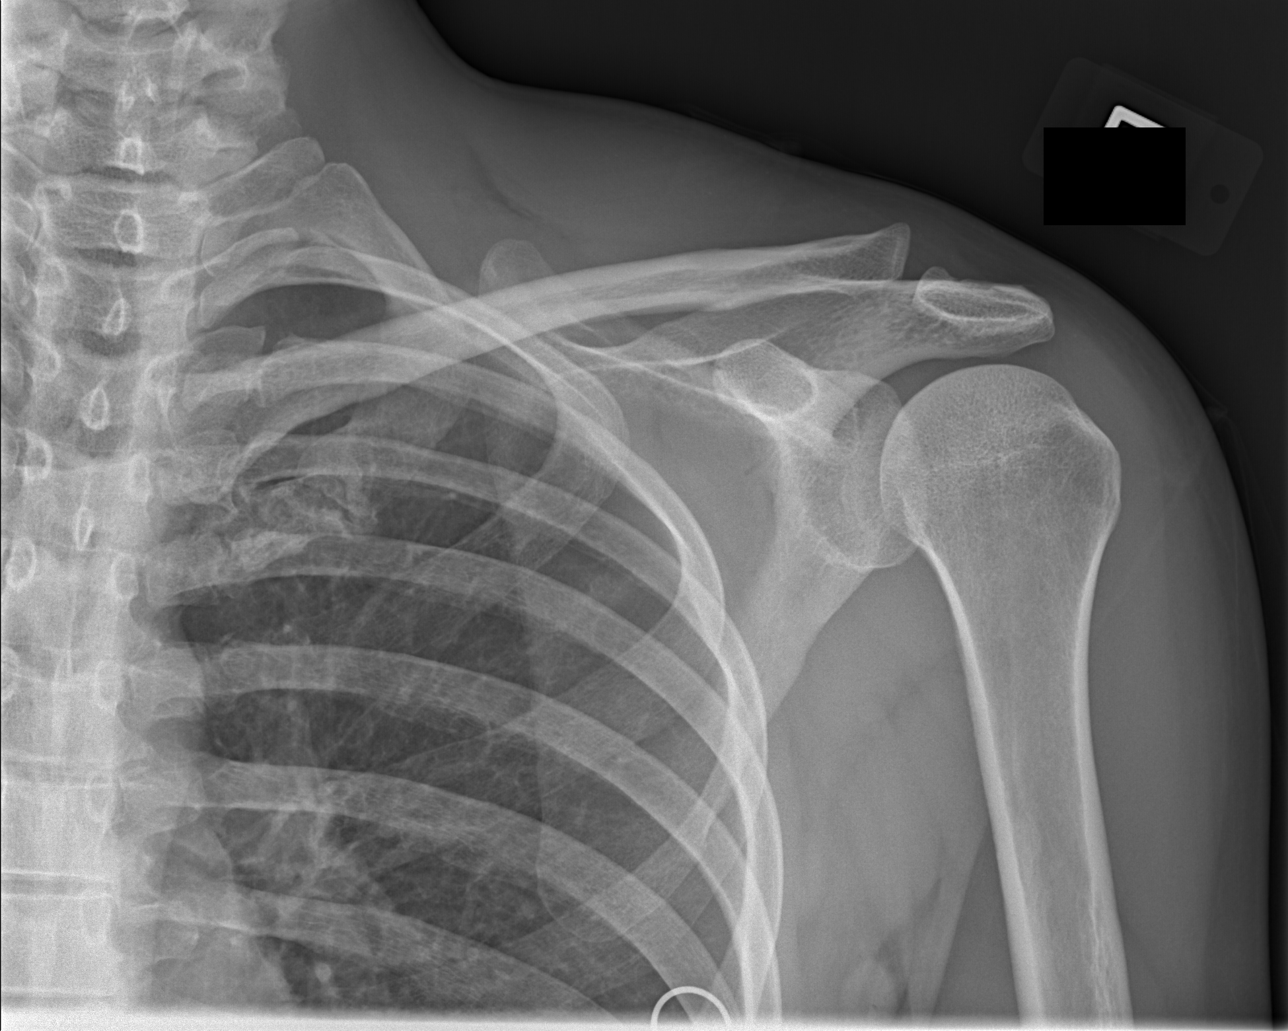
[im 3/3]
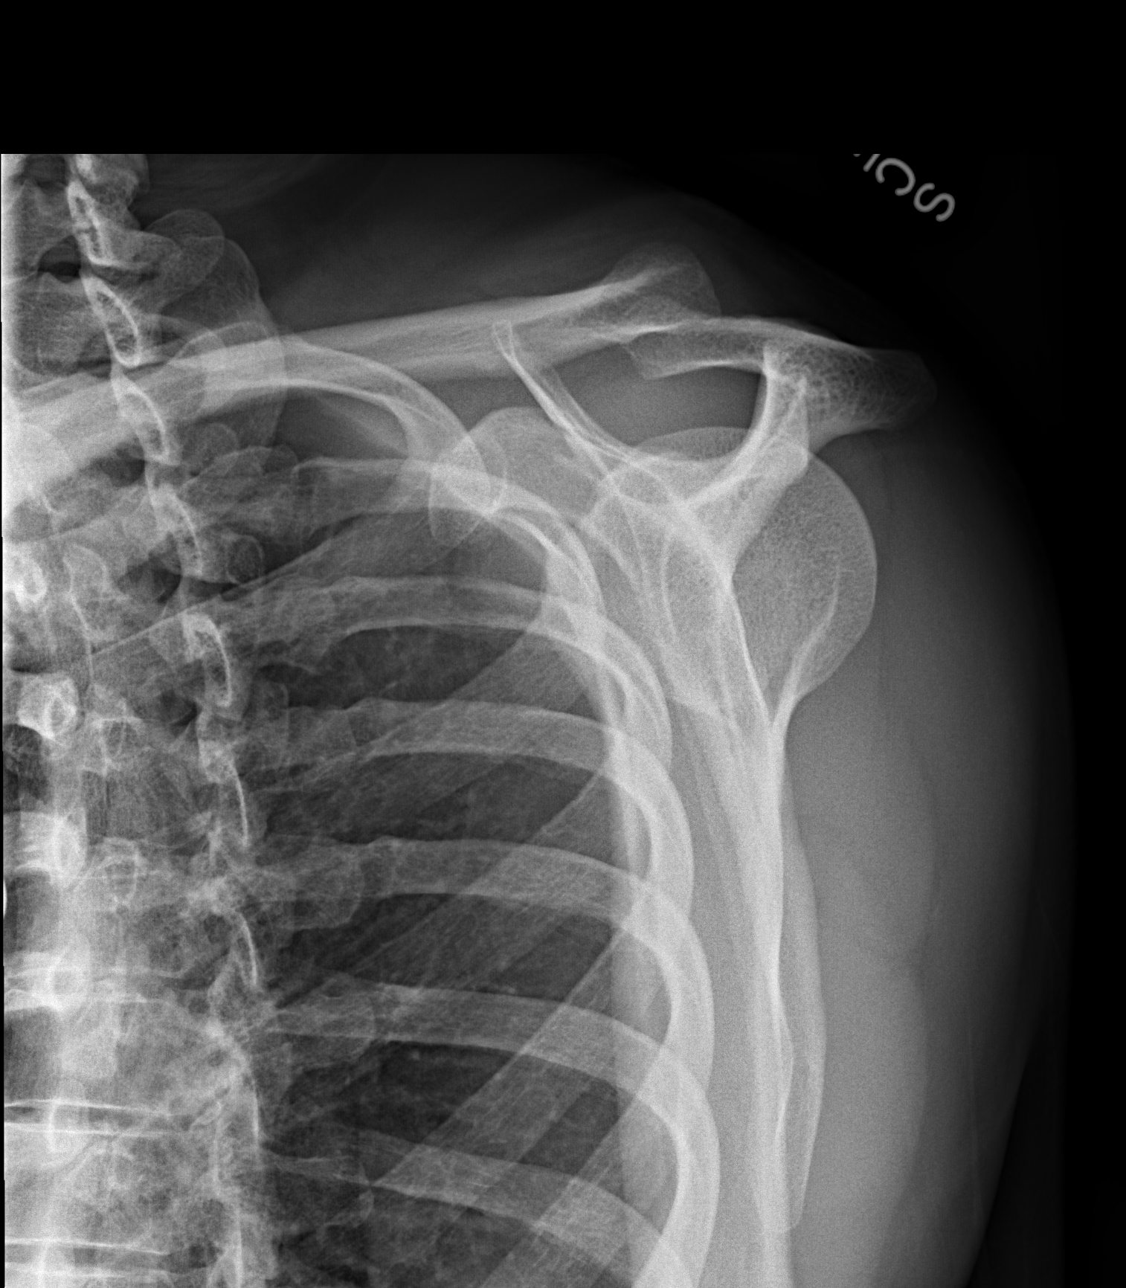

[3 of 3 positions shown; findings below may reference images not displayed]

FINDINGS: Internal rotation, external rotation, and Y scapular images
obtained. No demonstrable fracture or dislocation. Joint spaces
appear intact. No erosive change or intra-articular calcification.
Visualized left lung clear.
IMPRESSION: No fracture or dislocation.  No apparent arthropathy.

## 2020-10-20 ENCOUNTER — Other Ambulatory Visit: Payer: Self-pay | Admitting: Internal Medicine
